# Patient Record
Sex: Male | Born: 1990 | Race: Black or African American | Hispanic: No | Marital: Single | Smoking: Current every day smoker
Health system: Southern US, Community
[De-identification: ages and names within clinical notes are randomized; demographics above are authoritative.]

---
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
## 2011-03-05 LAB — I-STAT 8, (EC8 V) (CONVERTED LAB)
Acid-Base Excess: 1
BUN: 11
Bicarbonate: 25.7 — ABNORMAL HIGH
Chloride: 107
Glucose, Bld: 103 — ABNORMAL HIGH
HCT: 46
Hemoglobin: 15.6
Operator id: 285491
Potassium: 3.6
Sodium: 140
TCO2: 27
pCO2, Ven: 42 — ABNORMAL LOW
pH, Ven: 7.395 — ABNORMAL HIGH

## 2011-03-05 LAB — POCT I-STAT CREATININE
Creatinine, Ser: 1.3
Operator id: 285491

## 2011-03-05 LAB — CK: Total CK: 353 — ABNORMAL HIGH

## 2012-12-30 ENCOUNTER — Other Ambulatory Visit (HOSPITAL_COMMUNITY): Payer: Self-pay | Admitting: *Deleted

## 2012-12-30 ENCOUNTER — Ambulatory Visit (HOSPITAL_COMMUNITY)
Admission: RE | Admit: 2012-12-30 | Discharge: 2012-12-30 | Disposition: A | Discharge status: Home / Self Care | Source: Ambulatory Visit | Attending: Emergency Medicine | Admitting: Emergency Medicine

## 2012-12-30 ENCOUNTER — Encounter (HOSPITAL_COMMUNITY): Payer: Self-pay | Admitting: *Deleted

## 2012-12-30 ENCOUNTER — Emergency Department (HOSPITAL_COMMUNITY)
Admission: EM | Admit: 2012-12-30 | Discharge: 2012-12-30 | Disposition: A | Discharge status: Home / Self Care | Source: Home / Self Care | Attending: Emergency Medicine | Admitting: Emergency Medicine

## 2012-12-30 DIAGNOSIS — R609 Edema, unspecified: Secondary | ICD-10-CM

## 2012-12-30 DIAGNOSIS — S62319A Displaced fracture of base of unspecified metacarpal bone, initial encounter for closed fracture: Secondary | ICD-10-CM | POA: Insufficient documentation

## 2012-12-30 DIAGNOSIS — S62306A Unspecified fracture of fifth metacarpal bone, right hand, initial encounter for closed fracture: Secondary | ICD-10-CM

## 2012-12-30 DIAGNOSIS — W219XXA Striking against or struck by unspecified sports equipment, initial encounter: Secondary | ICD-10-CM | POA: Insufficient documentation

## 2012-12-30 DIAGNOSIS — Y9239 Other specified sports and athletic area as the place of occurrence of the external cause: Secondary | ICD-10-CM | POA: Insufficient documentation

## 2012-12-30 DIAGNOSIS — Z87891 Personal history of nicotine dependence: Secondary | ICD-10-CM | POA: Insufficient documentation

## 2012-12-30 DIAGNOSIS — M79609 Pain in unspecified limb: Secondary | ICD-10-CM | POA: Insufficient documentation

## 2012-12-30 DIAGNOSIS — Y92838 Other recreation area as the place of occurrence of the external cause: Secondary | ICD-10-CM | POA: Insufficient documentation

## 2012-12-30 DIAGNOSIS — IMO0002 Reserved for concepts with insufficient information to code with codable children: Secondary | ICD-10-CM | POA: Insufficient documentation

## 2012-12-30 DIAGNOSIS — Y9367 Activity, basketball: Secondary | ICD-10-CM | POA: Insufficient documentation

## 2012-12-30 NOTE — ED Notes (Signed)
Pt c/o right hand pain since last night. Pt states he was playing basketball when he slammed his hand into a pole. Swelling present. Pt had x-ray done pta which shows fracture of pinky finger.

## 2012-12-30 NOTE — ED Notes (Signed)
Hit pole of basketball goal last night. Pain got worse overnight, sent here for xray which showed R hand fracture.

## 2012-12-30 NOTE — ED Provider Notes (Signed)
History  This chart was scribed for Donnetta Hutching, MD, by Yevette Edwards, ED Scribe. This patient was seen in room [facility] and the patient's care was started at 2:31 PM  CSN: [id number] Arrival date & time 12/30/12  1307  First MD Initiated Contact with Patient 12/30/12 1424     Chief Complaint  Patient presents with  . Hand Injury    HPI HPI Comments: Jeffery Robinson is a 22 y.o. male who presents to the Emergency Department complaining of constant sharp pain to the proximal region of his right hand. He states the pain began last after he injured it playing basketball last night.  The pt is a former smoker, and he denies alcohol use.   History reviewed. No pertinent past medical history. History reviewed. No pertinent past surgical history. History reviewed. No pertinent family history. History  Substance Use Topics  . Smoking status: Former Games developer  . Smokeless tobacco: Not on file  . Alcohol Use: No    Review of Systems A complete 10 system review of systems was obtained, and all systems were negative except where indicated in the HPI and PE.   Allergies  Review of patient's allergies indicates no known allergies.  Home Medications   Current Outpatient Rx  Name  Route  Sig  Dispense  Refill  . ibuprofen (ADVIL,MOTRIN) 200 MG tablet   Oral   Take 400 mg by mouth every 6 (six) hours as needed for pain.          Triage Vitals: BP 126/74  Pulse 50  Temp(Src) 98.3 F (36.8 C) (Oral)  Resp 17  Ht 6\' 2"  (1.88 m)  Wt 215 lb (97.523 kg)  BMI 27.59 kg/m2  SpO2 97%  Physical Exam  Nursing note and vitals reviewed. Constitutional: He is oriented to person, place, and time. He appears well-developed and well-nourished.  HENT:  Head: Normocephalic and atraumatic.  Eyes: Conjunctivae and EOM are normal. Pupils are equal, round, and reactive to light.  Neck: Normal range of motion. Neck supple.  Musculoskeletal: Normal range of motion.  Tender at the base of his  fifth metacarpal.   Neurological: He is alert and oriented to person, place, and time.  Skin: Skin is warm and dry.  Psychiatric: He has a normal mood and affect.    ED Course  Procedures (including critical care time)  DIAGNOSTIC STUDIES: Oxygen Saturation is 97% on room air, normal by my interpretation.    COORDINATION OF CARE:  2:36 PM-Discussed treatment plan with patient, and the patient agreed to the plan.    Labs Reviewed - No data to display Dg Hand Complete Right  12/30/2012   *RADIOLOGY REPORT*  Clinical Data: Pain and swelling at fifth metacarpal region, trauma, hit hand on basketball pole  RIGHT HAND - COMPLETE 3+ VIEW  Comparison: None  Findings: Osseous mineralization normal. Joint spaces preserved. Displaced intra-articular fracture identified at base of right fifth metacarpal. No additional fracture, dislocation or bone destruction. Soft tissue swelling greatest at dorsum of right hand.  IMPRESSION: Displaced intra-articular fracture at base of right fifth metacarpal.   Original Report Authenticated By: Ulyses Southward, M.D.   No diagnosis found.  MDM  X-ray right hand reveals a displaced intra-articular fracture at base of the right fifth metacarpal.  Splint, ice, elevate, referral to orthopedic  I personally performed the services described in this documentation, which was scribed in my presence. The recorded information has been reviewed and is accurate.    Donnetta Hutching,  MD 12/30/12 1516

## 2014-04-22 ENCOUNTER — Encounter (HOSPITAL_COMMUNITY): Payer: Self-pay | Admitting: Emergency Medicine

## 2014-04-22 ENCOUNTER — Emergency Department (HOSPITAL_COMMUNITY)
Admission: EM | Admit: 2014-04-22 | Discharge: 2014-04-22 | Disposition: A | Discharge status: Home / Self Care | Attending: Emergency Medicine | Admitting: Emergency Medicine

## 2014-04-22 DIAGNOSIS — L02213 Cutaneous abscess of chest wall: Secondary | ICD-10-CM | POA: Insufficient documentation

## 2014-04-22 DIAGNOSIS — Z720 Tobacco use: Secondary | ICD-10-CM | POA: Insufficient documentation

## 2014-04-22 DIAGNOSIS — Z8614 Personal history of Methicillin resistant Staphylococcus aureus infection: Secondary | ICD-10-CM | POA: Insufficient documentation

## 2014-04-22 DIAGNOSIS — L0291 Cutaneous abscess, unspecified: Secondary | ICD-10-CM

## 2014-04-22 MED ORDER — LIDOCAINE-EPINEPHRINE (PF) 2 %-1:200000 IJ SOLN
10.0000 mL | Freq: Once | INTRAMUSCULAR | Status: AC
  Administered 2014-04-22: 10 mL via INTRADERMAL
  Filled 2014-04-22: qty 20

## 2014-04-22 MED ORDER — SULFAMETHOXAZOLE-TRIMETHOPRIM 800-160 MG PO TABS: 1.0000 | ORAL_TABLET | Freq: Two times a day (BID) | ORAL | Status: AC

## 2014-04-22 MED ORDER — LIDOCAINE-EPINEPHRINE 2 %-1:100000 IJ SOLN: 10.0000 mL | Freq: Once | INTRAMUSCULAR | Status: DC

## 2014-04-22 MED ORDER — LIDOCAINE-EPINEPHRINE 2 %-1:100000 IJ SOLN: 20.0000 mL | Freq: Once | INTRAMUSCULAR | Status: DC

## 2014-04-22 NOTE — ED Notes (Signed)
Pt from home with c/o abscess located on central chest with swelling and redness starting this am.  Pt reports hx of abscesses, NAD, A&O.

## 2014-04-22 NOTE — ED Provider Notes (Signed)
CSN: [id number]     Arrival date & time 04/22/14  1013 History   First MD Initiated Contact with Patient 04/22/14 1020     Chief Complaint  Patient presents with  . Abscess    Jeffery Robinson is a 23 y.o. male he presents to the ED with his mother complaining of an abscess located on his central chest that he first noticed this morning. He rates his pain at a 3 out of 10. Touching it makes his pain worse. Reports he's had a previous abscess in his right axilla 2 years ago. Denies history of skin infections or family or personal history of MRSA. Patient has tattoos to his chest but denies any recent tattooing. Denies fevers, chills, chest pain, palpitations, shortness of breath, cough, weakness, tingling, other lesions or rashes.   (Consider location/radiation/quality/duration/timing/severity/associated sxs/prior Treatment) Patient is a 23 y.o. male presenting with abscess. The history is provided by the patient.  Abscess Location:  Torso Torso abscess location: chest. Size:  3 cm Abscess quality: fluctuance, painful and redness   Abscess quality: not draining, no induration, no itching, no warmth and not weeping   Red streaking: no   Duration:  4 hours Progression:  Worsening Pain details:    Quality:  Throbbing   Severity:  Mild   Duration:  4 hours   Timing:  Constant   Progression:  Unchanged Chronicity:  New Context: not diabetes, not immunosuppression, not injected drug use, not insect bite/sting and not skin injury   Relieved by:  None tried Worsened by:  Nothing tried Ineffective treatments:  None tried Associated symptoms: no fatigue, no fever, no headaches, no nausea and no vomiting   Risk factors: prior abscess   Risk factors: no family hx of MRSA and no hx of MRSA     History reviewed. No pertinent past medical history. History reviewed. No pertinent past surgical history. History reviewed. No pertinent family history. History  Substance Use Topics  . Smoking  status: Current Every Day Smoker -- 1.00 packs/day    Types: Cigarettes  . Smokeless tobacco: Never Used  . Alcohol Use: Yes     Comment: a pint a day    Review of Systems  Constitutional: Negative for fever, chills and fatigue.  HENT: Negative for congestion, ear pain, rhinorrhea and sore throat.   Eyes: Negative for pain.  Respiratory: Negative for cough, shortness of breath and wheezing.   Cardiovascular: Negative for chest pain, palpitations and leg swelling.  Gastrointestinal: Negative for nausea, vomiting, abdominal pain, diarrhea and constipation.  Genitourinary: Negative for dysuria.  Musculoskeletal: Negative for myalgias.  Skin:       Abscess on chest   Neurological: Negative for weakness, light-headedness, numbness and headaches.  All other systems reviewed and are negative.     Allergies  Review of patient's allergies indicates no known allergies.  Home Medications   Prior to Admission medications   Medication Sig Start Date End Date Taking? Authorizing Provider  ibuprofen (ADVIL,MOTRIN) 200 MG tablet Take 400 mg by mouth every 6 (six) hours as needed for pain.    Historical Provider, MD  sulfamethoxazole-trimethoprim (SEPTRA DS) 800-160 MG per tablet Take 1 tablet by mouth every 12 (twelve) hours. 04/22/14   Einar GipWilliam Duncan Zela Sobieski, PA   BP 129/87 mmHg  Pulse 48  Temp(Src) 98.4 F (36.9 C) (Oral)  Resp 13  Ht 6\' 2"  (1.88 m)  Wt 230 lb (104.327 kg)  BMI 29.52 kg/m2  SpO2 98% Physical Exam  Constitutional: He  appears well-developed and well-nourished. No distress.  HENT:  Head: Normocephalic and atraumatic.  Eyes: Pupils are equal, round, and reactive to light. Right eye exhibits no discharge. Left eye exhibits no discharge.  Neck: Neck supple.  Cardiovascular: Normal rate, regular rhythm, normal heart sounds and intact distal pulses.  Exam reveals no gallop and no friction rub.   No murmur heard. Pulmonary/Chest: Effort normal and breath sounds normal. No  respiratory distress. He has no wheezes. He has no rales.  Abdominal: Soft. There is no tenderness.  Musculoskeletal: He exhibits no edema.  Lymphadenopathy:    He has no cervical adenopathy.  Neurological: He is alert. Coordination normal.  Skin: He is not diaphoretic.  3 cm fluctuant mass to the center of his chest overlying erythema without drainage.No evidence of surrounding erythema or cellulitis.  Psychiatric: He has a normal mood and affect. His behavior is normal.  Nursing note and vitals reviewed.   ED Course  INCISION AND DRAINAGE Date/Time: 04/22/2014 11:24 AM Performed by: Lawana ChambersANSIE, Janis Cuffe DUNCAN Authorized by: Lawana ChambersANSIE, Ashaya Raftery DUNCAN Consent: Verbal consent obtained. Risks and benefits: risks, benefits and alternatives were discussed Consent given by: patient Patient understanding: patient states understanding of the procedure being performed Patient consent: the patient's understanding of the procedure matches consent given Procedure consent: procedure consent matches procedure scheduled Site marked: the operative site was marked Required items: required blood products, implants, devices, and special equipment available Patient identity confirmed: verbally with patient Time out: Immediately prior to procedure a "time out" was called to verify the correct patient, procedure, equipment, support staff and site/side marked as required. Type: abscess Body area: trunk Location details: chest Anesthesia: local infiltration Local anesthetic: lidocaine 2% with epinephrine Anesthetic total: 3 ml Patient sedated: no Scalpel size: 11 Incision type: single straight Complexity: simple Drainage: purulent and  bloody Drainage amount: moderate Wound treatment: wound left open Packing material: none Patient tolerance: Patient tolerated the procedure well with no immediate complications   (including critical care time) Labs Review Labs Reviewed - No data to display  Imaging  Review No results found.   EKG Interpretation None      Filed Vitals:   04/22/14 1030 04/22/14 1031 04/22/14 1120  BP: 120/75  129/87  Pulse: 49  48  Temp:  98.4 F (36.9 C)   TempSrc:  Oral   Resp: 13    Height:  6\' 2"  (1.88 m)   Weight:  230 lb (104.327 kg)   SpO2: 99%  98%     MDM   Meds given in ED:  Medications  lidocaine-EPINEPHrine (XYLOCAINE W/EPI) 2 %-1:200000 (PF) injection 10 mL (10 mLs Intradermal Given 04/22/14 1046)    Discharge Medication List as of 04/22/2014 11:18 AM      Final diagnoses:  Abscess   Jeffery Robinson Presents to the ED with his mother for an abscess since this morning to the center of his chest. Incision and drainages performed by me. No packing was required. Patient tolerated the incision and drainage well. Discharge the patient on doxycycline. Advised the patient to follow-up in 3 days with his primary care provider or return to the ED for wound recheck. Advised to return to the ED with new or worsening symptoms or new concerns. Patient verbalized understanding and agreement with plan. Patient was discussed with and evaluated by PA ChadWest and she agrees with assessment and plan.      Lawana ChambersWilliam Duncan Vanita Cannell, PA 04/22/14 1425  Suzi RootsKevin E Steinl, MD 04/27/14 [phone]

## 2014-04-22 NOTE — Discharge Instructions (Signed)
Abscess °An abscess is an infected area that contains a collection of pus and debris. It can occur in almost any part of the body. An abscess is also known as a furuncle or boil. °CAUSES  °An abscess occurs when tissue gets infected. This can occur from blockage of oil or sweat glands, infection of hair follicles, or a minor injury to the skin. As the body tries to fight the infection, pus collects in the area and creates pressure under the skin. This pressure causes pain. People with weakened immune systems have difficulty fighting infections and get certain abscesses more often.  °SYMPTOMS °Usually an abscess develops on the skin and becomes a painful mass that is red, warm, and tender. If the abscess forms under the skin, you may feel a moveable soft area under the skin. Some abscesses break open (rupture) on their own, but most will continue to get worse without care. The infection can spread deeper into the body and eventually into the bloodstream, causing you to feel ill.  °DIAGNOSIS  °Your caregiver will take your medical history and perform a physical exam. A sample of fluid may also be taken from the abscess to determine what is causing your infection. °TREATMENT  °Your caregiver may prescribe antibiotic medicines to fight the infection. However, taking antibiotics alone usually does not cure an abscess. Your caregiver may need to make a small cut (incision) in the abscess to drain the pus. In some cases, gauze is packed into the abscess to reduce pain and to continue draining the area. °HOME CARE INSTRUCTIONS  °· Only take over-the-counter or prescription medicines for pain, discomfort, or fever as directed by your caregiver. °· If you were prescribed antibiotics, take them as directed. Finish them even if you start to feel better. °· If gauze is used, follow your caregiver's directions for changing the gauze. °· To avoid spreading the infection: °· Keep your draining abscess covered with a  bandage. °· Wash your hands well. °· Do not share personal care items, towels, or whirlpools with others. °· Avoid skin contact with others. °· Keep your skin and clothes clean around the abscess. °· Keep all follow-up appointments as directed by your caregiver. °SEEK MEDICAL CARE IF:  °· You have increased pain, swelling, redness, fluid drainage, or bleeding. °· You have muscle aches, chills, or a general ill feeling. °· You have a fever. °MAKE SURE YOU:  °· Understand these instructions. °· Will watch your condition. °· Will get help right away if you are not doing well or get worse. °Document Released: 03/11/2005 Document Revised: 12/01/2011 Document Reviewed: 08/14/2011 °ExitCare® Patient Information ©2015 [employer]. This information is not intended to replace advice given to you by your health care provider. Make sure you discuss any questions you have with your health care provider. ° °Abscess °Care After °An abscess (also called a boil or furuncle) is an infected area that contains a collection of pus. Signs and symptoms of an abscess include pain, tenderness, redness, or hardness, or you may feel a moveable soft area under your skin. An abscess can occur anywhere in the body. The infection may spread to surrounding tissues causing cellulitis. A cut (incision) by the surgeon was made over your abscess and the pus was drained out. Gauze may have been packed into the space to provide a drain that will allow the cavity to heal from the inside outwards. The boil may be painful for 5 to 7 days. Most people with a boil do not have   high fevers. Your abscess, if seen early, may not have localized, and may not have been lanced. If not, another appointment may be required for this if it does not get better on its own or with medications. °HOME CARE INSTRUCTIONS  °· Only take over-the-counter or prescription medicines for pain, discomfort, or fever as directed by your caregiver. °· When you bathe, soak and then  remove gauze or iodoform packs at least daily or as directed by your caregiver. You may then wash the wound gently with mild soapy water. Repack with gauze or do as your caregiver directs. °SEEK IMMEDIATE MEDICAL CARE IF:  °· You develop increased pain, swelling, redness, drainage, or bleeding in the wound site. °· You develop signs of generalized infection including muscle aches, chills, fever, or a general ill feeling. °· An oral temperature above 102° F (38.9° C) develops, not controlled by medication. °See your caregiver for a recheck if you develop any of the symptoms described above. If medications (antibiotics) were prescribed, take them as directed. °Document Released: 12/18/2004 Document Revised: 08/24/2011 Document Reviewed: 08/15/2007 °ExitCare® Patient Information ©2015 [employer]. This information is not intended to replace advice given to you by your health care provider. Make sure you discuss any questions you have with your health care provider. ° °Emergency Department Resource Guide °1) Find a Doctor and Pay Out of Pocket °Although you won't have to find out who is covered by your insurance plan, it is a good idea to ask around and get recommendations. You will then need to call the office and see if the doctor you have chosen will accept you as a new patient and what types of options they offer for patients who are self-pay. Some doctors offer discounts or will set up payment plans for their patients who do not have insurance, but you will need to ask so you aren't surprised when you get to your appointment. ° °2) Contact Your Local Health Department °Not all health departments have doctors that can see patients for sick visits, but many do, so it is worth a call to see if yours does. If you don't know where your local health department is, you can check in your phone book. The CDC also has a tool to help you locate your state's health department, and many state websites also have listings of  all of their local health departments. ° °3) Find a Walk-in Clinic °If your illness is not likely to be very severe or complicated, you may want to try a walk in clinic. These are popping up all over the country in pharmacies, drugstores, and shopping centers. They're usually staffed by nurse practitioners or physician assistants that have been trained to treat common illnesses and complaints. They're usually fairly quick and inexpensive. However, if you have serious medical issues or chronic medical problems, these are probably not your best option. ° °No Primary Care Doctor: °- Call [facility] at  832-8000 - they can help you locate a primary care doctor that  accepts your insurance, provides certain services, etc. °- Physician Referral Service- 1-800-533-3463 ° °Chronic Pain Problems: °Organization         Address  Phone   Notes  °[facility]  (336) 297-2271 Patients need to be referred by their primary care doctor.  ° °Medication Assistance: °Organization         Address  Phone   Notes  °[place] [facility] [address] °[place], [place] [address] (  336) 641-8030 --Must be a resident of [place] °-- Must have NO insurance coverage whatsoever (no Medicaid/ Medicare, etc.) °-- The pt. MUST have a primary care doctor that directs their care regularly and follows them in the community °  °[facility]  (866) 331-1348   °[employer]  (888) 892-1162   ° °Agencies that provide inexpensive medical care: °Organization         Address  Phone   Notes  °[facility]  (336) 832-8035   °[facility]    (336) 832-7272   °[facility] [address] °[place], [place] [address] (336) 832-4777   °[facility] [address], °[place] (336) 271-4999   °[facility]    (336) 373-0678   °[facility]    (336) 272-1050   °[facility] ° [address],  [place] Phone:  (336) 832-4444, Fax:  (336) 832-4440 Hours of Operation:  9 am - 6 pm, M-F.  Also accepts Medicaid/Medicare and self-pay.  °[facility] ° [address], [place] Phone: (336) 832-3150, Fax: (336) [phone]. Hours of Operation:  8:30 am - 5:30 pm, M-F.  Also accepts Medicaid and self-pay.  °[facility] [place] [address], [place] Phone: (336) 878-6027   °[facility] [address], [place], [place] (336)723-1848, Ext. 123 Mondays & Thursdays: 7-9 AM.  First 15 patients are seen on a first come, first serve basis. °  ° °Medicaid-accepting [place] Providers: ° °Organization         Address  Phone   Notes  °[facility] [address], [place] (336) 641-2100 Also accepts self-pay patients.  °[facility] [address], [place] ° (336) 856-9996   °[facility] [address], [place] (336) 288-8857   °[facility] [address] [place] [address], [place] (336) 299-7000   °Veita Bland [address], [place]  ° (336) 373-1557 Only accepts [place] Access Medicaid patients after they have their name applied to their card.  ° °Self-Pay (no insurance) in [place]: ° °Organization         Address  Phone   Notes  °Sickle Cell Patients, [facility] [address], [place] (336) 832-1970   °[facility] [address], [place] (336) 832-4400   °[facility] ° [address], [place] (336) 992-4800   °[facility]/Dr. Osei-Bonsu ° [address] [place] [address], [place] or [address], [place] (336) [phone] Phone number for both [place] and [place] locations is the same.  °[facility] [address], [place] (336) 299-0000   °[facility] [address] [place] [address], [place] or [address] (336) 852-7530 °(336) 878-2260   °[facility] [address],  (336) 350-1642, phone; (336) 294-5005, fax Sees patients 1st and 3rd Saturday of every month.  Must not qualify for public or private insurance (i.e. Medicaid, Medicare, [place] Health Choice, Veterans' Benefits) • Household income should be no more than 200% of the poverty level •The clinic cannot treat you if you are pregnant or think you are pregnant • Sexually transmitted diseases are not treated at the clinic.  ° ° °Dental Care: °Organization           Address  Phone  Notes  °[facility] [address], [place] (336) 641-6152 Accepts children up to age 21 who are enrolled in Medicaid or [place] Health Choice; pregnant women with a Medicaid card; and children who have applied for Medicaid or [place] Health Choice, but were declined, whose parents can pay a reduced fee at time of service.  °[facility] [place]  [address], [place] (336) 641-7733 Accepts children up to age 21 who are enrolled in Medicaid or  [place] Health Choice; pregnant women with a Medicaid card; and children who have applied for Medicaid or [place] Health Choice, but were declined, whose parents can pay a reduced fee at time of service.  °[facility] ° [address], [place] (336) 641-4533 Patients are seen by appointment only. Walk-ins are not accepted. [facility] will see patients 18 years of age and older. °Monday - Tuesday (8am-5pm) °Most Wednesdays (8:30-5pm) °$30 per visit, cash only  °[facility] ° [address], [place] (336) 641-4533 Patients are seen by appointment only. Walk-ins are not accepted. [facility] will see patients 18 years of age and older. °One Wednesday Evening (Monthly: Volunteer Based).  $30 per visit, cash only  °[facility]   (919) 537-3737 for adults; Children under age 4, call Graduate Pediatric Dentistry at (919) [phone]. Children aged 4-14, please call (919) [phone] to request a pediatric application. ° Dental services are provided in all areas of dental care including fillings, crowns and bridges, complete and partial dentures, implants, gum treatment, root canals, and extractions. Preventive care is also provided. Treatment is provided to both adults and children. °Patients are selected via a lottery and there is often a waiting list. °  °[facility] [address], °[place] ° (336) 763-8833 [email or web address] °  °[facility] [address], [place], [place] (336)723-1848, Ext. 123 Second and Fourth Thursday of each month, opens at 6:30 AM; Clinic ends at 9 AM.  Patients are seen on a first-come first-served basis, and a limited number are seen during each clinic.  ° °[facility] ° [address], [place], [place] (336) 723-7904   Eligibility Requirements °You must have lived in [place], [place], or [place] counties for at least the last three months. °  You cannot be eligible for state or federal sponsored healthcare insurance, including Veterans Administration, Medicaid, or Medicare. °  You generally cannot be eligible for healthcare insurance through your employer.  °  How to apply: °Eligibility screenings are held every Tuesday and Wednesday afternoon from 1:00 pm until 4:00 pm. You do not need an appointment for the interview!  °[facility] [address], [place], [place] 336-631-2330   °[facility]  336-342-8273   °[facility]  336-703-3100   °[facility]  336-570-6415   ° °Behavioral Health Resources in the Community: °Intensive Outpatient Programs °Organization         Address  Phone  Notes  °[facility] [address], [place], [place] 336-878-6098   °[facility] [address], [place], [place] 336-832-9800   °[facility] [address], [place], [place] ° 336-882-2125   °[facility] [address],  °[place], [place] 1-800-853-5163 or 336-641-4981   °Substance Abuse Resources °  Organization         Address  Phone  Notes  °[facility]  336-882-2125   °[facility]  336-784-9470   °[facility]  336-285-9073   °[facility]  336-845-3988   °Residential & Outpatient Substance Abuse Program  1-800-659-3381   °Psychological Services °Organization         Address  Phone  Notes  °[facility]  336- 832-9600   °[facility]  336- 378-7881   °[facility] [address], [place] 1-800-853-5163 or 336-641-4981   ° °Mobile Crisis Teams °Organization         Address  Phone  Notes  °[facility], Mobile Crisis Care Unit  1-877-626-1772   °Assertive °[facility] ° [address] [place], [place] 336-834-9664   °Sharon DeEsch [address] °[place] 336-554-5454   ° °Self-Help/Support Groups °Organization         Address  Phone             Notes  °[facility] - variety of support groups  336- 373-1402 Call for more information  °Narcotics Anonymous (NA), [facility] [address], °[place]  2 meetings at this location  ° °Residential Treatment Programs °Organization         Address  Phone  Notes  °[facility] [address],    °[place]  1-866-801-8205   °[facility] ° [address], [place], [place] 704-293-8524   °[facility] [address], [place] 336-845-3988 Admissions: 8am-3pm M-F  °[facility] [address]    °[place], [place] 336-841-1104   °[facility] [address], [place], [place] 336-379-7146   °[facility] [address]  °[place], [place] 336-285-9073    °[facility] Intensive Outpatient [address], [place], [place] 336-852-3033   °[facility] [address]  °[place], [place] 1-877-615-2722 or 336-784-9470   °[facility] [address] [place], [place] 336-227-7417 Accepts Medicaid  °[facility] [address]  °[place] 1-800-659-3381 Substance Abuse/Addiction Treatment  ° °[place] Behavioral Health Resources °Organization         Address  Phone  Notes  °[facility]  (888) 581-9988   °Julie Brannon, PhD [address] [place], [place]   (336) 349-5553 or (336) 951-0000   °[facility]   [address] °[place], [place] (336) 349-4454   °[facility] [address], [place], [place] (336) 342-8316 Insurance/Medicaid/sponsorship through [facility]  °[facility] [address]                                    [place], [place] (336) 342-8316 Therapy/tele-psych/case  °[facility] [address]  ° [place], [place] (336) 349-2233    °Dr. Arfeen  (336) 349-4544   °[facility] [place]  [employer] [place] [facility] 1) [address], [place] °2) [address], [place] °3)  [address], [place] (336) 349-3220 °(336) 342-7768 ° °(336) 342-8140   °[place] [facility] (336) 342-1394 or (336) 342-3537 (After Hours)    ° ° ° °

## 2015-03-17 ENCOUNTER — Emergency Department (EMERGENCY_DEPARTMENT_HOSPITAL): Admit: 2015-03-17 | Discharge: 2015-03-17 | Disposition: A | Discharge status: Home / Self Care

## 2015-03-17 ENCOUNTER — Emergency Department (HOSPITAL_COMMUNITY)

## 2015-03-17 ENCOUNTER — Encounter (HOSPITAL_COMMUNITY): Payer: Self-pay | Admitting: Emergency Medicine

## 2015-03-17 ENCOUNTER — Emergency Department (HOSPITAL_COMMUNITY)
Admission: EM | Admit: 2015-03-17 | Discharge: 2015-03-17 | Disposition: A | Discharge status: Home / Self Care | Attending: Emergency Medicine | Admitting: Emergency Medicine

## 2015-03-17 DIAGNOSIS — W3400XD Accidental discharge from unspecified firearms or gun, subsequent encounter: Secondary | ICD-10-CM

## 2015-03-17 DIAGNOSIS — M79605 Pain in left leg: Secondary | ICD-10-CM

## 2015-03-17 DIAGNOSIS — M79609 Pain in unspecified limb: Secondary | ICD-10-CM | POA: Diagnosis not present

## 2015-03-17 DIAGNOSIS — S7292XA Unspecified fracture of left femur, initial encounter for closed fracture: Secondary | ICD-10-CM

## 2015-03-17 DIAGNOSIS — S72355D Nondisplaced comminuted fracture of shaft of left femur, subsequent encounter for closed fracture with routine healing: Secondary | ICD-10-CM | POA: Insufficient documentation

## 2015-03-17 DIAGNOSIS — S81832D Puncture wound without foreign body, left lower leg, subsequent encounter: Secondary | ICD-10-CM

## 2015-03-17 DIAGNOSIS — Z720 Tobacco use: Secondary | ICD-10-CM | POA: Insufficient documentation

## 2015-03-17 DIAGNOSIS — Z792 Long term (current) use of antibiotics: Secondary | ICD-10-CM | POA: Insufficient documentation

## 2015-03-17 DIAGNOSIS — X58XXXD Exposure to other specified factors, subsequent encounter: Secondary | ICD-10-CM | POA: Insufficient documentation

## 2015-03-17 MED ORDER — OXYCODONE-ACETAMINOPHEN 5-325 MG PO TABS: 1.0000 | ORAL_TABLET | ORAL | Status: AC | PRN

## 2015-03-17 MED ORDER — OXYCODONE-ACETAMINOPHEN 5-325 MG PO TABS
1.0000 | ORAL_TABLET | Freq: Once | ORAL | Status: AC
  Administered 2015-03-17: 1 via ORAL

## 2015-03-17 MED ORDER — OXYCODONE-ACETAMINOPHEN 5-325 MG PO TABS
ORAL_TABLET | ORAL | Status: AC
  Filled 2015-03-17: qty 1

## 2015-03-17 MED ORDER — OXYCODONE-ACETAMINOPHEN 5-325 MG PO TABS
1.0000 | ORAL_TABLET | Freq: Once | ORAL | Status: AC
  Administered 2015-03-17: 1 via ORAL
  Filled 2015-03-17: qty 1
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
## 2015-03-17 NOTE — Progress Notes (Signed)
VASCULAR LAB PRELIMINARY  PRELIMINARY  PRELIMINARY  PRELIMINARY  Left lower extremity venous duplex completed.    Preliminary report:  There is no DVT or SVT noted in the left lower extremity.   Jadae Steinke, RVT 03/17/2015, 6:13 PM

## 2015-03-17 NOTE — ED Notes (Signed)
Pt stable, ambulatory, states understanding of discharge instructions 
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
## 2015-11-03 ENCOUNTER — Encounter (HOSPITAL_COMMUNITY): Payer: Self-pay

## 2015-11-03 ENCOUNTER — Emergency Department (HOSPITAL_COMMUNITY)
Admission: EM | Admit: 2015-11-03 | Discharge: 2015-11-03 | Disposition: A | Discharge status: Home / Self Care | Payer: Self-pay | Attending: Emergency Medicine | Admitting: Emergency Medicine

## 2015-11-03 DIAGNOSIS — Z792 Long term (current) use of antibiotics: Secondary | ICD-10-CM | POA: Insufficient documentation

## 2015-11-03 DIAGNOSIS — K1379 Other lesions of oral mucosa: Secondary | ICD-10-CM | POA: Insufficient documentation

## 2015-11-03 DIAGNOSIS — F17210 Nicotine dependence, cigarettes, uncomplicated: Secondary | ICD-10-CM | POA: Insufficient documentation

## 2015-11-03 DIAGNOSIS — K1370 Unspecified lesions of oral mucosa: Secondary | ICD-10-CM

## 2015-11-03 NOTE — ED Provider Notes (Signed)
CSN: [id number]     Arrival date & time 11/03/15  1658 History  By signing my name below, I, Randa Evens, attest that this documentation has been prepared under the direction and in the presence of [employer], PA-C. Electronically Signed: Randa Evens, ED Scribe. 11/03/2015. 6:36 PM.    Chief Complaint  Patient presents with  . Mouth Lesions   Patient is a 25 y.o. male presenting with mouth sores. The history is provided by the patient. No language interpreter was used.  Mouth Lesions Associated symptoms: no congestion, no ear pain, no fever, no neck pain and no sore throat    HPI Comments: Jeffery Robinson is a 25 y.o. male who presents to the Emergency Department complaining of worsening oral lesion to inside of his right lip onset 1 week prior. The lesion is located to the right, anterior lower lip buccal mucosal. Pt states that lesion is growing in size.  Denies pain, discharge, trouble swallowing, fever, chills, night sweats, SOB, CP, sore throat, fatigue, weight loss or other related symptoms.Pt denies any treatments tried PTA. Denies any alleviating or worsening factors. Pt denies coming in contact with anyone who has similar symptoms. Denies recent antibiotic use. Denies HX of STI's.    History reviewed. No pertinent past medical history. History reviewed. No pertinent past surgical history. History reviewed. No pertinent family history. Social History  Substance Use Topics  . Smoking status: Current Every Day Smoker -- 1.00 packs/day    Types: Cigarettes  . Smokeless tobacco: Never Used  . Alcohol Use: Yes     Comment: a pint a day    Review of Systems  Constitutional: Negative for fever, chills, diaphoresis, fatigue and unexpected weight change.  HENT: Positive for mouth sores. Negative for congestion, ear pain, sinus pressure, sore throat, trouble swallowing and voice change.   Respiratory: Negative for shortness of breath.   Cardiovascular: Negative for  chest pain.  Musculoskeletal: Negative for neck pain and neck stiffness.  Neurological: Negative for headaches.      Allergies  Review of patient's allergies indicates no known allergies.  Home Medications   Prior to Admission medications   Medication Sig Start Date End Date Taking? Authorizing Provider  oxyCODONE-acetaminophen (PERCOCET/ROXICET) 5-325 MG tablet Take 1-2 tablets by mouth every 4 (four) hours as needed for severe pain. 03/17/15   Forde Dandy, MD  sulfamethoxazole-trimethoprim (SEPTRA DS) 800-160 MG per tablet Take 1 tablet by mouth every 12 (twelve) hours. 04/22/14   Waynetta Pean, PA-C   BP 99/55 mmHg  Pulse 64  Temp(Src) 97.9 F (36.6 C) (Oral)  Resp 16  SpO2 95%   Physical Exam  Constitutional: He appears well-developed and well-nourished. No distress.  HENT:  Head: Normocephalic and atraumatic.  Right Ear: External ear normal.  Left Ear: External ear normal.  Nose: Right sinus exhibits no maxillary sinus tenderness and no frontal sinus tenderness. Left sinus exhibits no maxillary sinus tenderness and no frontal sinus tenderness.  Mouth/Throat: Uvula is midline, oropharynx is clear and moist and mucous membranes are normal. Oral lesions present. No trismus in the jaw. No dental abscesses, uvula swelling or lacerations. No oropharyngeal exudate, posterior oropharyngeal edema, posterior oropharyngeal erythema or tonsillar abscesses.  1cm painless, smooth, soft, non-tender, dome-shaped, transclucent lesion noted on right, anterior, lower buccal mucosa. No discharge, warmth, erythema, or swelling noted.   Eyes: Conjunctivae and EOM are normal. Pupils are equal, round, and reactive to light. Right eye exhibits no discharge. Left eye exhibits no discharge.  No scleral icterus.  Neck: Trachea normal, normal range of motion and phonation normal. Neck supple. No tracheal tenderness present. No rigidity. No tracheal deviation and normal range of motion present.   Cardiovascular: Normal rate.   Pulmonary/Chest: Effort normal. No stridor. No respiratory distress.  Musculoskeletal: Normal range of motion.  Lymphadenopathy:    He has no cervical adenopathy.  Neurological: He is alert. Coordination normal.  Skin: Skin is warm and dry. He is not diaphoretic.  Psychiatric: He has a normal mood and affect. His behavior is normal.  Nursing note and vitals reviewed.   ED Course  Procedures (including critical care time) DIAGNOSTIC STUDIES: Oxygen Saturation is 96% on RA, normal by my interpretation.    COORDINATION OF CARE: 6:35 PM-Discussed treatment plan with pt at bedside and pt agreed to plan.    Labs Review Labs Reviewed - No data to display  Imaging Review No results found.    EKG Interpretation None      MDM   Final diagnoses:  Oral lesion   Jeffery Robinson is a 25 y.o. male presents to ED with complaint of oral lesion x one week. Patient is afebrile and nontoxic. Blood pressure is low vital signs otherwise stable. Physical exam remarkable for 1 cm painless, smooth, soft, mobile, nontender, dome-shaped, translucent lesion on lower right anterior buccal mucosa. No discharge, warmth, swelling, or erythema noted No trismus. No oral cavity swelling or lingual swelling. Uvula is midline. No erythema or edema noted on posterior pharynx. No tonsillar hypertrophy or exudate. Doubt Ludwig's angina. Doubt HSV. Doubt erythema multiforme or Stevens-Brazil syndrome. Doubt carcinoma or leukoplakia. Doubt syphilis. Suspect oral mucocele.   Discussed results and plan with patient. Suspect will spontaneously resolve in approximately 1-2 weeks. Provided contact information for [facility] for reevaluation in 1 to 2 weeks. Discussed return precautions. Patient voiced understanding and is agreeable.  I personally performed the services described in this documentation, which was scribed in my presence. The recorded information has  been reviewed and is accurate.      Macario Golds [place], [place] 11/04/15 5093  Wandra Arthurs, MD 11/04/15 [phone]

## 2015-11-03 NOTE — Discharge Instructions (Signed)
Read the information below.  Use the prescribed medication as directed.  Please discuss all new medications with your pharmacist.  Apply cream twice daily.  Be Sure to follow up with [facility] int he next 1-2 weeks if no improvement.  You may return to the Emergency Department at any time for worsening condition or any new symptoms that concern you. If you develop fever, chills, night sweats, pain, oral swelling, difficulty eating, difficulty swallowing, difficulty breathing, or neck pain return to the ED immediately.

## 2015-11-03 NOTE — ED Notes (Signed)
Jeffery Robinson comes from home with a lesion on the lower right lip.  Denies any pain associated with lesion.  Jeffery Robinson A&Ox4

## 2015-11-03 NOTE — ED Notes (Signed)
Patient Alert and oriented X4. Stable and ambulatory. Patient verbalized understanding of the discharge instructions.  Patient belongings were taken by the patient.  
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
## 2018-03-29 ENCOUNTER — Emergency Department (HOSPITAL_COMMUNITY)
Admission: EM | Admit: 2018-03-29 | Discharge: 2018-03-29 | Disposition: A | Discharge status: Home / Self Care | Payer: Self-pay | Attending: Emergency Medicine | Admitting: Emergency Medicine

## 2018-03-29 ENCOUNTER — Encounter (HOSPITAL_COMMUNITY): Payer: Self-pay | Admitting: Emergency Medicine

## 2018-03-29 DIAGNOSIS — F17210 Nicotine dependence, cigarettes, uncomplicated: Secondary | ICD-10-CM | POA: Insufficient documentation

## 2018-03-29 DIAGNOSIS — L0231 Cutaneous abscess of buttock: Secondary | ICD-10-CM

## 2018-03-29 MED ORDER — LIDOCAINE-EPINEPHRINE (PF) 2 %-1:200000 IJ SOLN
20.0000 mL | Freq: Once | INTRAMUSCULAR | Status: AC
  Administered 2018-03-29: 20 mL
  Filled 2018-03-29: qty 20

## 2018-03-29 MED ORDER — MELOXICAM 15 MG PO TABS: 15.0000 mg | ORAL_TABLET | Freq: Every day | ORAL | 0 refills | Status: AC | PRN

## 2018-03-29 NOTE — ED Provider Notes (Signed)
[facility]-EMERGENCY DEPT Provider Note   CSN: [id number] Arrival date & time: 03/29/18  1320     History   Chief Complaint Chief Complaint  Patient presents with  . Abscess    groin    HPI Kester Jeffery Robinson is a 27 y.o. male.who presents with c/o abscess of the right gluteal fold.  He noticed some tenderness in the region about 3 days ago.  The pain has progressively worsened.  He now noticed some swelling.  He denies any systemic symptoms such as fever or chills.  HPI  History reviewed. No pertinent past medical history.  There are no active problems to display for this patient.   History reviewed. No pertinent surgical history.      Home Medications    Prior to Admission medications   Medication Sig Start Date End Date Taking? Authorizing Provider  oxyCODONE-acetaminophen (PERCOCET/ROXICET) 5-325 MG tablet Take 1-2 tablets by mouth every 4 (four) hours as needed for severe pain. Patient not taking: Reported on 03/29/2018 03/17/15   Lavera Guise, MD  sulfamethoxazole-trimethoprim (SEPTRA DS) 800-160 MG per tablet Take 1 tablet by mouth every 12 (twelve) hours. Patient not taking: Reported on 03/29/2018 04/22/14   Everlene Farrier, PA-C    Family History No family history on file.  Social History Social History   Tobacco Use  . Smoking status: Current Every Day Smoker    Packs/day: 1.00    Types: Cigarettes  . Smokeless tobacco: Never Used  Substance Use Topics  . Alcohol use: Yes    Comment: a pint a day  . Drug use: Yes    Frequency: 21.0 times per week    Types: Marijuana     Allergies   Patient has no known allergies.   Review of Systems Review of Systems  Ten systems reviewed and are negative for acute change, except as noted in the HPI.   Physical Exam Updated Vital Signs BP (!) 145/92 (BP Location: Left Arm)   Pulse 87   Temp 98.5 F (36.9 C) (Oral)   Resp 16   SpO2 100%   Physical Exam  Constitutional: He  appears well-developed and well-nourished. No distress.  HENT:  Head: Normocephalic and atraumatic.  Eyes: Conjunctivae are normal. No scleral icterus.  Neck: Normal range of motion. Neck supple.  Cardiovascular: Normal rate, regular rhythm and normal heart sounds.  Pulmonary/Chest: Effort normal and breath sounds normal. No respiratory distress.  Abdominal: Soft. There is no tenderness.  Genitourinary:  Genitourinary Comments: 4 cm of fluctuance on the inferior R gluteal fold.   2 cm surrounding induration  Musculoskeletal: He exhibits no edema.  Neurological: He is alert.  Skin: Skin is warm and dry. He is not diaphoretic.  Psychiatric: His behavior is normal.  Nursing note and vitals reviewed.    ED Treatments / Results  Labs (all labs ordered are listed, but only abnormal results are displayed) Labs Reviewed - No data to display  EKG None  Radiology No results found.  Procedures Procedures (including critical care time) INCISION AND DRAINAGE Performed by: Arthor Captain Consent: Verbal consent obtained. Risks and benefits: risks, benefits and alternatives were discussed Type: abscess  Body area: gluteal fold  Anesthesia: local infiltration  Incision was made with a scalpel.  Local anesthetic: lidocaine 2% w epinephrine  Anesthetic total: 10 ml  Complexity: complex Blunt dissection to break up loculations  Drainage: purulent  Drainage amount: copious  Packing material: 1/4 in iodoform gauze  Patient tolerance: Patient tolerated  the procedure well with no immediate complications.  .Medications Ordered in ED Medications  lidocaine-EPINEPHrine (XYLOCAINE W/EPI) 2 %-1:100000 (with pres) injection 20 mL (has no administration in time range)     Initial Impression / Assessment and Plan / ED Course  I have reviewed the triage vital signs and the nursing notes.  Pertinent labs & imaging results that were available during my care of the patient were  reviewed by me and considered in my medical decision making (see chart for details).     Patient with skin abscess amenable to incision and drainage.   wound recheck in 2 days. Encouraged home warm soaks and flushing.  Mild signs of cellulitis is surrounding skin.  Will d/c to home.  No antibiotic therapy is indicated.   Final Clinical Impressions(s) / ED Diagnoses   Final diagnoses:  None    ED Discharge Orders    None       Arthor Captain, PA-C 03/29/18 1538    Sabas Sous, MD 03/29/18 1651

## 2018-03-29 NOTE — ED Triage Notes (Signed)
Pt c/o abscess in right groin area for couple days. Denies any drainage

## 2018-03-29 NOTE — Discharge Instructions (Addendum)
You may keep the area bandaged for the next 48 hours until you return for a wound recheck, unless you have soiled or soaked the bandage with blood. You have packing in your wound. Do not remove the packing. If the packing falls out, it is ok.  Contact a health care provider if: You have more redness, swelling, or pain around your abscess. You have more fluid or blood coming from your abscess. Your abscess feels warm to the touch. You have more pus or a bad smell coming from your abscess. You have a fever. You have muscle aches. You have chills or a general ill feeling. Get help right away if: You have severe pain. You see red streaks on your skin spreading away from the abscess.

## 2018-03-31 ENCOUNTER — Emergency Department (HOSPITAL_COMMUNITY)
Admission: EM | Admit: 2018-03-31 | Discharge: 2018-03-31 | Disposition: A | Discharge status: Home / Self Care | Payer: Self-pay | Attending: Emergency Medicine | Admitting: Emergency Medicine

## 2018-03-31 ENCOUNTER — Other Ambulatory Visit: Payer: Self-pay

## 2018-03-31 DIAGNOSIS — Z5189 Encounter for other specified aftercare: Secondary | ICD-10-CM

## 2018-03-31 DIAGNOSIS — F17210 Nicotine dependence, cigarettes, uncomplicated: Secondary | ICD-10-CM | POA: Insufficient documentation

## 2018-03-31 DIAGNOSIS — L0231 Cutaneous abscess of buttock: Secondary | ICD-10-CM | POA: Insufficient documentation

## 2018-03-31 NOTE — Discharge Instructions (Signed)
Please follow up with your primary care provider within 5-7 days for re-evaluation of your symptoms. If you do not have a primary care provider, information for a healthcare clinic has been provided for you to make arrangements for follow up care.  Please return to the emergency room immediately if you experience any new or worsening symptoms or any symptoms that indicate worsening infection such as fevers, increased redness/swelling/pain, warmth, or drainage from the affected area.    

## 2018-03-31 NOTE — ED Triage Notes (Addendum)
Patient here for recheck of groin/scrotal abscess. Denies any redness, swelling, pain, or discharge. Patient was told to come back to recheck

## 2018-03-31 NOTE — ED Provider Notes (Signed)
[facility]-EMERGENCY DEPT Provider Note   CSN: [id number] Arrival date & time: 03/31/18  1626     History   Chief Complaint Chief Complaint  Patient presents with  . Follow-up    HPI Jeffery Robinson is a 27 y.o. male.  HPI   27 year old male presenting to the emergency department today for a wound recheck.  Patient seen in the ED on 03/29/2018 and had abscess drained at the right inferior gluteal fold.  Abscess was packed and he returns today for packing removal.  States that his pain has improved.  He has had some continued drainage from the area.  Denies any worsening of his symptoms.  No fevers or chills.  Denies exacerbating or alleviating factors.  Has been doing sitz bath at home.  No past medical history on file.  There are no active problems to display for this patient.   No past surgical history on file.      Home Medications    Prior to Admission medications   Medication Sig Start Date End Date Taking? Authorizing Provider  meloxicam (MOBIC) 15 MG tablet Take 1 tablet (15 mg total) by mouth daily as needed for pain. 03/29/18   Arthor Captain, PA-C  oxyCODONE-acetaminophen (PERCOCET/ROXICET) 5-325 MG tablet Take 1-2 tablets by mouth every 4 (four) hours as needed for severe pain. Patient not taking: Reported on 03/29/2018 03/17/15   Lavera Guise, MD  sulfamethoxazole-trimethoprim (SEPTRA DS) 800-160 MG per tablet Take 1 tablet by mouth every 12 (twelve) hours. Patient not taking: Reported on 03/29/2018 04/22/14   Everlene Farrier, PA-C    Family History No family history on file.  Social History Social History   Tobacco Use  . Smoking status: Current Every Day Smoker    Packs/day: 1.00    Types: Cigarettes  . Smokeless tobacco: Never Used  Substance Use Topics  . Alcohol use: Yes    Comment: a pint a day  . Drug use: Yes    Frequency: 21.0 times per week    Types: Marijuana     Allergies   Patient has no known  allergies.   Review of Systems Review of Systems  Constitutional: Negative for chills and fever.  Skin: Positive for wound.     Physical Exam Updated Vital Signs BP 126/82 (BP Location: Left Arm)   Pulse 76   Temp 98.3 F (36.8 C) (Oral)   Resp 16   Ht 6\' 3"  (1.905 m)   Wt 116.6 kg   SpO2 96%   BMI 32.12 kg/m   Physical Exam  Constitutional: He is oriented to person, place, and time. He appears well-developed and well-nourished. No distress.  Eyes: Conjunctivae are normal.  Cardiovascular: Normal rate.  Pulmonary/Chest: Effort normal.  Neurological: He is alert and oriented to person, place, and time.  Skin: Skin is warm and dry.  Packing in place to right inferior gluteal fold.  Packing removed.  There is a small amount of surrounding induration where there is no significant erythema or warmth.  No obvious areas of fluctuance.     ED Treatments / Results  Labs (all labs ordered are listed, but only abnormal results are displayed) Labs Reviewed - No data to display  EKG None  Radiology No results found.  Procedures Procedures (including critical care time)  Medications Ordered in ED Medications - No data to display   Initial Impression / Assessment and Plan / ED Course  I have reviewed the triage vital signs and the  nursing notes.  Pertinent labs & imaging results that were available during my care of the patient were reviewed by me and considered in my medical decision making (see chart for details).     Final Clinical Impressions(s) / ED Diagnoses   Final diagnoses:  Wound check, abscess   Pt with abscess seen two days ago with I&D. Chart reviewed in [facility]. Pt has been doing a very good job of taking care of the site at home. The site is healthy appearing, with only a small amount of purulent drainage. Packing removed. incision is open and without significant drainage. No surrounding cellulitis and no palpable fluctuance indicating deeper infection or  residual abscess. Pt is also without any pain or tenderness in the area. Discussed home care and return precautions. advised to return if worse. All questions answered and pt stable for discharge.  ED Discharge Orders    None       Karrie Meres, [place] 03/31/18 1740    Alvira Monday, MD 04/03/18 1205

## 2020-09-05 ENCOUNTER — Other Ambulatory Visit: Payer: Self-pay

## 2021-03-14 ENCOUNTER — Emergency Department (HOSPITAL_COMMUNITY)
Admission: EM | Admit: 2021-03-14 | Discharge: 2021-03-14 | Disposition: A | Discharge status: Home / Self Care | Payer: Self-pay | Attending: Emergency Medicine | Admitting: Emergency Medicine

## 2021-03-14 DIAGNOSIS — Z5321 Procedure and treatment not carried out due to patient leaving prior to being seen by health care provider: Secondary | ICD-10-CM | POA: Insufficient documentation

## 2021-03-14 DIAGNOSIS — H02843 Edema of right eye, unspecified eyelid: Secondary | ICD-10-CM | POA: Insufficient documentation

## 2021-03-14 DIAGNOSIS — H5789 Other specified disorders of eye and adnexa: Secondary | ICD-10-CM | POA: Insufficient documentation

## 2021-03-14 NOTE — ED Triage Notes (Addendum)
Pt here d/t possible eye infection/pink eye X2 days Swelling, redness noted. Denies drainage, pain and itchiness. No vision changes
